# Patient Record
Sex: Male | Born: 1935 | Race: White | Hispanic: No
Health system: Southern US, Community
[De-identification: ages and names within clinical notes are randomized; demographics above are authoritative.]

## PROBLEM LIST (undated history)

## (undated) DIAGNOSIS — I2510 Atherosclerotic heart disease of native coronary artery without angina pectoris: Secondary | ICD-10-CM

## (undated) DIAGNOSIS — F0280 Dementia in other diseases classified elsewhere without behavioral disturbance: Secondary | ICD-10-CM

## (undated) DIAGNOSIS — K5900 Constipation, unspecified: Secondary | ICD-10-CM

## (undated) DIAGNOSIS — F329 Major depressive disorder, single episode, unspecified: Secondary | ICD-10-CM

## (undated) DIAGNOSIS — I10 Essential (primary) hypertension: Secondary | ICD-10-CM

## (undated) DIAGNOSIS — R32 Unspecified urinary incontinence: Secondary | ICD-10-CM

## (undated) DIAGNOSIS — E785 Hyperlipidemia, unspecified: Secondary | ICD-10-CM

## (undated) DIAGNOSIS — D649 Anemia, unspecified: Secondary | ICD-10-CM

## (undated) DIAGNOSIS — E876 Hypokalemia: Secondary | ICD-10-CM

## (undated) DIAGNOSIS — C679 Malignant neoplasm of bladder, unspecified: Secondary | ICD-10-CM

## (undated) DIAGNOSIS — E559 Vitamin D deficiency, unspecified: Secondary | ICD-10-CM

## (undated) DIAGNOSIS — N289 Disorder of kidney and ureter, unspecified: Secondary | ICD-10-CM

## (undated) DIAGNOSIS — G309 Alzheimer's disease, unspecified: Secondary | ICD-10-CM

## (undated) DIAGNOSIS — J449 Chronic obstructive pulmonary disease, unspecified: Secondary | ICD-10-CM

## (undated) DIAGNOSIS — I2699 Other pulmonary embolism without acute cor pulmonale: Secondary | ICD-10-CM

## (undated) DIAGNOSIS — K5790 Diverticulosis of intestine, part unspecified, without perforation or abscess without bleeding: Secondary | ICD-10-CM

## (undated) DIAGNOSIS — R1311 Dysphagia, oral phase: Secondary | ICD-10-CM

## (undated) DIAGNOSIS — K219 Gastro-esophageal reflux disease without esophagitis: Secondary | ICD-10-CM

---

## 2017-09-06 ENCOUNTER — Encounter: Payer: Self-pay | Admitting: Emergency Medicine

## 2017-09-06 ENCOUNTER — Emergency Department
Admission: EM | Admit: 2017-09-06 | Discharge: 2017-09-06 | Disposition: A | Discharge status: Home / Self Care | Payer: Medicare Other | Attending: Emergency Medicine | Admitting: Emergency Medicine

## 2017-09-06 DIAGNOSIS — Y999 Unspecified external cause status: Secondary | ICD-10-CM | POA: Insufficient documentation

## 2017-09-06 DIAGNOSIS — Y92129 Unspecified place in nursing home as the place of occurrence of the external cause: Secondary | ICD-10-CM | POA: Insufficient documentation

## 2017-09-06 DIAGNOSIS — G309 Alzheimer's disease, unspecified: Secondary | ICD-10-CM | POA: Insufficient documentation

## 2017-09-06 DIAGNOSIS — S20229A Contusion of unspecified back wall of thorax, initial encounter: Secondary | ICD-10-CM | POA: Diagnosis not present

## 2017-09-06 DIAGNOSIS — S3992XA Unspecified injury of lower back, initial encounter: Secondary | ICD-10-CM | POA: Diagnosis present

## 2017-09-06 DIAGNOSIS — W19XXXA Unspecified fall, initial encounter: Secondary | ICD-10-CM | POA: Insufficient documentation

## 2017-09-06 DIAGNOSIS — Y939 Activity, unspecified: Secondary | ICD-10-CM | POA: Insufficient documentation

## 2017-09-06 DIAGNOSIS — M79669 Pain in unspecified lower leg: Secondary | ICD-10-CM | POA: Diagnosis not present

## 2017-09-06 HISTORY — DX: Dementia in other diseases classified elsewhere, unspecified severity, without behavioral disturbance, psychotic disturbance, mood disturbance, and anxiety: F02.80

## 2017-09-06 HISTORY — DX: Alzheimer's disease, unspecified: G30.9

## 2017-09-06 NOTE — ED Notes (Signed)
Pt urinated 700 cc. Sample obtained in case needed. Sent to lab

## 2017-09-06 NOTE — ED Provider Notes (Signed)
[facility] Emergency Department Provider Note  Time seen: 11:52 AM  I have reviewed the triage vital signs and the nursing notes.   HISTORY  Chief Complaint Fall    HPI Joaquim Tolen is a 83 y.o. male with a past medical history of dementia presents to the emergency department after a fall. According to EMS report the patient had an unwitnessed fall at [facility]. Patient complained of leg pain and back pain so they sent the patient to the emergency department. Upon arrival the patient is awake alert, oriented to self only, but denies any complaints. Denies any pain. Patient is calm, cooperative and pleasant.Does not recall the fall.  Past Medical History:  Diagnosis Date  . Alzheimer's dementia     There are no active problems to display for this patient.   No past surgical history on file.  Prior to Admission medications   Not on File    Allergies not on file  No family history on file.  Social History Social History  Substance Use Topics  . Smoking status: Not on file  . Smokeless tobacco: Not on file  . Alcohol use Not on file    Review of Systems Unable to obtain an adequate/accurate review of systems due to significant baseline dementia.  ____________________________________________   PHYSICAL EXAM:  VITAL SIGNS: ED Triage Vitals  Enc Vitals Group     BP 09/06/17 1139 (!) 192/97     Pulse Rate 09/06/17 1139 70     Resp 09/06/17 1139 20     Temp 09/06/17 1139 97.8 F (36.6 C)     Temp Source 09/06/17 1139 Oral     SpO2 09/06/17 1139 97 %     Weight 09/06/17 1141 185 lb 6.5 oz (84.1 kg)     Height 09/06/17 1141 5\' 8"  (1.727 m)     Head Circumference --      Peak Flow --      Pain Score --      Pain Loc --      Pain Edu? --      Excl. in [place]? --    Constitutional: Alert, oriented to self, but not to place or time. Calm, cooperative, pleasant, no distress. Eyes: Normal exam ENT   Head: Normocephalic and  atraumatic. No abrasions, lacerations or hematomas.   Mouth/Throat: Mucous membranes are moist. Cardiovascular: Normal rate, regular rhythm.  Respiratory: Normal respiratory effort without tachypnea nor retractions. Breath sounds are clear Gastrointestinal: Soft and nontender. No distention.   Musculoskeletal: Nontender extremities, atraumatic in appearance. Great range of motion in all extremities and in all joints without any elicited pain. No appreciable edema. C-spine, T-spine and L-spine are nontender. Neurologic:  Normal speech and language. No gross focal neurologic deficits are appreciated. Skin:  Skin is warm, dry and intact.  Psychiatric: Mood and affect are normal.   ____________________________________________   INITIAL IMPRESSION / ASSESSMENT AND PLAN / ED COURSE  Pertinent labs & imaging results that were available during my care of the patient were reviewed by me and considered in my medical decision making (see chart for details).  The patient presents to the emergency department from [facility] after an unwitnessed fall. Differential diagnosis at this time would include contusions, Musca skeletal pain, no traumatic issues. Overall the patient appears extremely well, has no complaints but does have dementia. On physical exam patient has no complaints of tenderness, great range of motion in all joints and extremities, no signs of trauma  on exam, atraumatic head, no C, T or L-spine tenderness. No bruising or abrasions noted. As the patient appears extremely well we will attempt to ambulate the patient to ensure he is not having any pain. The patient has no pain did not believe imaging is warranted given a normal physical exam, and we will likely discharge back to his nursing facility.  Family is now here. Patient continues to appear well, has no complaints. Patient does not ambulate at baseline uses a wheelchair. Family agrees patient appears well. We will discharge back to  his nursing facility.  ____________________________________________   FINAL CLINICAL IMPRESSION(S) / ED DIAGNOSES  Cheral Marker, MD 09/06/17 1341

## 2017-09-06 NOTE — ED Triage Notes (Signed)
Patient presents to the ED via EMS from [facility].  EMS reports they were called to facility due to an unwitnessed fall and that patient had been complaining of back and leg pain.  Patient has history of alzheimer's.  At this time patient is alert but not oriented to self only.  Patient denies pain at this time and does not remember fall.

## 2017-09-06 NOTE — ED Notes (Signed)
Paperwork sent back to nursing home with ems. Pt unable to sign

## 2018-07-07 ENCOUNTER — Other Ambulatory Visit: Payer: Self-pay

## 2018-07-07 ENCOUNTER — Encounter: Payer: Self-pay | Admitting: Emergency Medicine

## 2018-07-07 ENCOUNTER — Emergency Department
Admission: EM | Admit: 2018-07-07 | Discharge: 2018-07-07 | Disposition: A | Discharge status: Skilled Nursing Facility | Payer: Medicare Other | Attending: Emergency Medicine | Admitting: Emergency Medicine

## 2018-07-07 DIAGNOSIS — F0280 Dementia in other diseases classified elsewhere without behavioral disturbance: Secondary | ICD-10-CM

## 2018-07-07 DIAGNOSIS — F0281 Dementia in other diseases classified elsewhere with behavioral disturbance: Secondary | ICD-10-CM | POA: Diagnosis not present

## 2018-07-07 DIAGNOSIS — J449 Chronic obstructive pulmonary disease, unspecified: Secondary | ICD-10-CM | POA: Diagnosis not present

## 2018-07-07 DIAGNOSIS — G301 Alzheimer's disease with late onset: Secondary | ICD-10-CM | POA: Diagnosis not present

## 2018-07-07 DIAGNOSIS — I10 Essential (primary) hypertension: Secondary | ICD-10-CM | POA: Insufficient documentation

## 2018-07-07 DIAGNOSIS — Z7982 Long term (current) use of aspirin: Secondary | ICD-10-CM | POA: Insufficient documentation

## 2018-07-07 DIAGNOSIS — Z79899 Other long term (current) drug therapy: Secondary | ICD-10-CM | POA: Diagnosis not present

## 2018-07-07 DIAGNOSIS — F02818 Dementia in other diseases classified elsewhere, unspecified severity, with other behavioral disturbance: Secondary | ICD-10-CM | POA: Diagnosis present

## 2018-07-07 DIAGNOSIS — I2510 Atherosclerotic heart disease of native coronary artery without angina pectoris: Secondary | ICD-10-CM | POA: Insufficient documentation

## 2018-07-07 DIAGNOSIS — R4182 Altered mental status, unspecified: Secondary | ICD-10-CM | POA: Diagnosis present

## 2018-07-07 HISTORY — DX: Constipation, unspecified: K59.00

## 2018-07-07 HISTORY — DX: Dysphagia, oral phase: R13.11

## 2018-07-07 HISTORY — DX: Major depressive disorder, single episode, unspecified: F32.9

## 2018-07-07 HISTORY — DX: Unspecified urinary incontinence: R32

## 2018-07-07 HISTORY — DX: Essential (primary) hypertension: I10

## 2018-07-07 HISTORY — DX: Disorder of kidney and ureter, unspecified: N28.9

## 2018-07-07 HISTORY — DX: Vitamin D deficiency, unspecified: E55.9

## 2018-07-07 HISTORY — DX: Hypokalemia: E87.6

## 2018-07-07 HISTORY — DX: Secondary malignant neoplasm of genital organs: C67.9

## 2018-07-07 HISTORY — DX: Atherosclerotic heart disease of native coronary artery without angina pectoris: I25.10

## 2018-07-07 HISTORY — DX: Diverticulosis of intestine, part unspecified, without perforation or abscess without bleeding: K57.90

## 2018-07-07 HISTORY — DX: Anemia, unspecified: D64.9

## 2018-07-07 HISTORY — DX: Chronic obstructive pulmonary disease, unspecified: J44.9

## 2018-07-07 HISTORY — DX: Gastro-esophageal reflux disease without esophagitis: K21.9

## 2018-07-07 HISTORY — DX: Other pulmonary embolism without acute cor pulmonale: I26.99

## 2018-07-07 HISTORY — DX: Hyperlipidemia, unspecified: E78.5

## 2018-07-07 LAB — URINALYSIS, COMPLETE (UACMP) WITH MICROSCOPIC
BILIRUBIN URINE: NEGATIVE
Bacteria, UA: NONE SEEN
Glucose, UA: NEGATIVE mg/dL
HGB URINE DIPSTICK: NEGATIVE
Ketones, ur: NEGATIVE mg/dL
Leukocytes, UA: NEGATIVE
NITRITE: NEGATIVE
PROTEIN: NEGATIVE mg/dL
Specific Gravity, Urine: 1.008 (ref 1.005–1.030)
pH: 7 (ref 5.0–8.0)

## 2018-07-07 LAB — COMPREHENSIVE METABOLIC PANEL
ALT: 43 U/L (ref 0–44)
ANION GAP: 5 (ref 5–15)
AST: 60 U/L — ABNORMAL HIGH (ref 15–41)
Albumin: 3.3 g/dL — ABNORMAL LOW (ref 3.5–5.0)
Alkaline Phosphatase: 68 U/L (ref 38–126)
BUN: 21 mg/dL (ref 8–23)
CHLORIDE: 108 mmol/L (ref 98–111)
CO2: 26 mmol/L (ref 22–32)
CREATININE: 1.14 mg/dL (ref 0.61–1.24)
Calcium: 8.7 mg/dL — ABNORMAL LOW (ref 8.9–10.3)
GFR, EST NON AFRICAN AMERICAN: 57 mL/min — AB (ref >60)
Glucose, Bld: 93 mg/dL (ref 70–99)
POTASSIUM: 3.7 mmol/L (ref 3.5–5.1)
SODIUM: 139 mmol/L (ref 135–145)
Total Bilirubin: 1.1 mg/dL (ref 0.3–1.2)
Total Protein: 6.4 g/dL — ABNORMAL LOW (ref 6.5–8.1)

## 2018-07-07 LAB — CBC WITH DIFFERENTIAL/PLATELET
BASOS PCT: 2 %
Basophils Absolute: 0.1 10*3/uL (ref 0–0.1)
EOS ABS: 0.5 10*3/uL (ref 0–0.7)
Eosinophils Relative: 10 %
HEMATOCRIT: 39.2 % — AB (ref 40.0–52.0)
Hemoglobin: 13.2 g/dL (ref 13.0–18.0)
LYMPHS ABS: 1.2 10*3/uL (ref 1.0–3.6)
Lymphocytes Relative: 24 %
MCH: 31.4 pg (ref 26.0–34.0)
MCHC: 33.7 g/dL (ref 32.0–36.0)
MCV: 93.2 fL (ref 80.0–100.0)
Monocytes Absolute: 0.5 10*3/uL (ref 0.2–1.0)
Monocytes Relative: 10 %
NEUTROS ABS: 2.6 10*3/uL (ref 1.4–6.5)
Neutrophils Relative %: 54 %
Platelets: 124 10*3/uL — ABNORMAL LOW (ref 150–440)
RBC: 4.2 MIL/uL — AB (ref 4.40–5.90)
RDW: 14.8 % — ABNORMAL HIGH (ref 11.5–14.5)
WBC: 4.7 10*3/uL (ref 3.8–10.6)

## 2018-07-07 MED ORDER — LISINOPRIL 10 MG PO TABS
40.0000 mg | ORAL_TABLET | Freq: Once | ORAL | Status: AC
  Administered 2018-07-07: 40 mg via ORAL
  Filled 2018-07-07: qty 4
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
## 2018-07-07 NOTE — Discharge Instructions (Addendum)
Patient with normal medical evaluation.  He is doing well here and cooperating with everyone.  Please have him follow-up with his doctor and return for any further problems.

## 2018-07-07 NOTE — ED Notes (Signed)
Called [facility] for transport to [place]

## 2018-07-07 NOTE — ED Notes (Signed)
Pt being transported back to [facility] by ems.
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
## 2018-07-12 ENCOUNTER — Emergency Department: Payer: Medicare Other

## 2018-07-12 ENCOUNTER — Other Ambulatory Visit
Admission: RE | Admit: 2018-07-12 | Discharge: 2018-07-12 | Disposition: A | Discharge status: Home / Self Care | Payer: Medicare Other | Source: Ambulatory Visit | Attending: Internal Medicine | Admitting: Internal Medicine

## 2018-07-12 ENCOUNTER — Emergency Department
Admission: EM | Admit: 2018-07-12 | Discharge: 2018-07-12 | Disposition: A | Discharge status: Skilled Nursing Facility | Payer: Medicare Other | Attending: Emergency Medicine | Admitting: Emergency Medicine

## 2018-07-12 ENCOUNTER — Other Ambulatory Visit: Payer: Self-pay

## 2018-07-12 ENCOUNTER — Encounter: Payer: Self-pay | Admitting: Emergency Medicine

## 2018-07-12 DIAGNOSIS — Z8546 Personal history of malignant neoplasm of prostate: Secondary | ICD-10-CM | POA: Diagnosis not present

## 2018-07-12 DIAGNOSIS — I10 Essential (primary) hypertension: Secondary | ICD-10-CM | POA: Diagnosis not present

## 2018-07-12 DIAGNOSIS — R319 Hematuria, unspecified: Secondary | ICD-10-CM

## 2018-07-12 DIAGNOSIS — N390 Urinary tract infection, site not specified: Secondary | ICD-10-CM | POA: Diagnosis not present

## 2018-07-12 DIAGNOSIS — J449 Chronic obstructive pulmonary disease, unspecified: Secondary | ICD-10-CM | POA: Diagnosis not present

## 2018-07-12 DIAGNOSIS — Z79899 Other long term (current) drug therapy: Secondary | ICD-10-CM | POA: Diagnosis not present

## 2018-07-12 DIAGNOSIS — F0280 Dementia in other diseases classified elsewhere without behavioral disturbance: Secondary | ICD-10-CM | POA: Diagnosis not present

## 2018-07-12 DIAGNOSIS — G309 Alzheimer's disease, unspecified: Secondary | ICD-10-CM | POA: Diagnosis not present

## 2018-07-12 DIAGNOSIS — I2510 Atherosclerotic heart disease of native coronary artery without angina pectoris: Secondary | ICD-10-CM | POA: Insufficient documentation

## 2018-07-12 DIAGNOSIS — Z7982 Long term (current) use of aspirin: Secondary | ICD-10-CM | POA: Insufficient documentation

## 2018-07-12 DIAGNOSIS — R451 Restlessness and agitation: Secondary | ICD-10-CM | POA: Insufficient documentation

## 2018-07-12 LAB — URINALYSIS, COMPLETE (UACMP) WITH MICROSCOPIC
BACTERIA UA: NONE SEEN
SQUAMOUS EPITHELIAL / LPF: NONE SEEN (ref 0–5)
Specific Gravity, Urine: 1.014 (ref 1.005–1.030)
WBC, UA: >50 WBC/hpf — ABNORMAL HIGH (ref 0–5)

## 2018-07-12 LAB — CBC WITH DIFFERENTIAL/PLATELET
BASOS ABS: 0.1 10*3/uL (ref 0–0.1)
Basophils Relative: 1 %
Eosinophils Absolute: 0.3 10*3/uL (ref 0–0.7)
Eosinophils Relative: 4 %
HEMATOCRIT: 38.2 % — AB (ref 40.0–52.0)
Hemoglobin: 12.9 g/dL — ABNORMAL LOW (ref 13.0–18.0)
Lymphocytes Relative: 18 %
Lymphs Abs: 1.3 10*3/uL (ref 1.0–3.6)
MCH: 31.3 pg (ref 26.0–34.0)
MCHC: 33.6 g/dL (ref 32.0–36.0)
MCV: 93.2 fL (ref 80.0–100.0)
MONO ABS: 0.6 10*3/uL (ref 0.2–1.0)
Monocytes Relative: 8 %
NEUTROS ABS: 5.1 10*3/uL (ref 1.4–6.5)
Neutrophils Relative %: 69 %
Platelets: 132 10*3/uL — ABNORMAL LOW (ref 150–440)
RBC: 4.11 MIL/uL — ABNORMAL LOW (ref 4.40–5.90)
RDW: 14.7 % — AB (ref 11.5–14.5)
WBC: 7.4 10*3/uL (ref 3.8–10.6)

## 2018-07-12 LAB — COMPREHENSIVE METABOLIC PANEL
ALK PHOS: 68 U/L (ref 38–126)
ALT: 40 U/L (ref 0–44)
AST: 55 U/L — AB (ref 15–41)
Albumin: 3.4 g/dL — ABNORMAL LOW (ref 3.5–5.0)
Anion gap: 7 (ref 5–15)
BILIRUBIN TOTAL: 1.1 mg/dL (ref 0.3–1.2)
BUN: 21 mg/dL (ref 8–23)
CALCIUM: 8.7 mg/dL — AB (ref 8.9–10.3)
CO2: 26 mmol/L (ref 22–32)
Chloride: 105 mmol/L (ref 98–111)
Creatinine, Ser: 1.05 mg/dL (ref 0.61–1.24)
GFR calc Af Amer: >60 mL/min (ref >60)
Glucose, Bld: 105 mg/dL — ABNORMAL HIGH (ref 70–99)
POTASSIUM: 3.6 mmol/L (ref 3.5–5.1)
Sodium: 138 mmol/L (ref 135–145)
TOTAL PROTEIN: 6.5 g/dL (ref 6.5–8.1)

## 2018-07-12 LAB — PROTIME-INR
INR: 1.1
Prothrombin Time: 14.1 seconds (ref 11.4–15.2)

## 2018-07-12 LAB — APTT: APTT: 31 s (ref 24–36)

## 2018-07-12 LAB — LIPASE, BLOOD: Lipase: 27 U/L (ref 11–51)

## 2018-07-12 MED ORDER — AMOXICILLIN-POT CLAVULANATE 875-125 MG PO TABS: 1.0000 | ORAL_TABLET | Freq: Two times a day (BID) | ORAL | 0 refills | Status: DC

## 2018-07-12 MED ORDER — AMOXICILLIN-POT CLAVULANATE 875-125 MG PO TABS: 1.0000 | ORAL_TABLET | Freq: Two times a day (BID) | ORAL | 0 refills | Status: AC

## 2018-07-12 MED ORDER — SODIUM CHLORIDE 0.9 % IV SOLN
1.0000 g | Freq: Once | INTRAVENOUS | Status: AC
  Administered 2018-07-12: 1 g via INTRAVENOUS
  Filled 2018-07-12: qty 10

## 2018-07-12 NOTE — ED Notes (Signed)
Daughter left before pt - pt unable to sign for discharge.

## 2018-07-12 NOTE — ED Notes (Signed)
Spoke with [facility] - gave them an update and they know pt will be returning by ems.

## 2018-07-12 NOTE — ED Notes (Signed)
Spoke with lab. They sent a urine sample this morning for Steven Ruiz from [facility]. Our order is a duplicate .

## 2018-07-12 NOTE — ED Provider Notes (Addendum)
[facility] Emergency Department Provider Note  ____________________________________________   First MD Initiated Contact with Patient 07/12/18 1145     (approximate)  I have reviewed the triage vital signs and the nursing notes.   HISTORY  Chief Complaint Hematuria  EM caveat: Dementia prohibits some elements of history and physical  HPI Steven Ruiz is a 84 y.o. male with a history of Alzheimer's, COPD, depression pulmonary embolism  Patient presents today for evaluation of slight increased agitation, and hematuria reported his care facility.  Patient himself has no complaints at this time.  He is resting comfortably.  Also reported that the patient had a urinalysis sent from his care facility today.  They have been seeing hematuria occurring about every 6-7 hours.  He has had blood noted in his depends.  No report of any fevers.  Occasionally has had some slight agitation, this is of been evidently been apparent also over the last 1 week or so and was seen in the ER for similar.  Patient denies pain.  Denies discomfort or concerns himself.  He is not aware of his hematuria.  Past Medical History:  Diagnosis Date  . Alzheimer's dementia   . Anemia   . Constipation   . COPD (chronic obstructive pulmonary disease) ([place])   . Coronary artery disease   . Diverticulosis   . Dysphagia, oral phase   . GERD (gastroesophageal reflux disease)   . Hyperlipemia   . Hypertension   . Hypokalemia   . Major depressive disorder   . 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 Pulmonary embolism ([place])   . Renal disorder   . Urinary incontinence   . Vitamin D deficiency     Patient Active Problem List   Diagnosis Date Noted  . Alzheimer's type dementia with late onset with behavioral disturbance 07/07/2018    History reviewed. No pertinent surgical history.  Prior to Admission medications   Medication Sig Start  Date End Date Taking? Authorizing Provider  acetaminophen (TYLENOL) 325 MG tablet Take 650 mg by mouth 3 (three) times daily as needed for mild pain.     [provider]  amoxicillin-clavulanate (AUGMENTIN) 875-125 MG tablet Take 1 tablet by mouth 2 (two) times daily for 7 days. 07/12/18 07/19/18  Delman Kitten, MD  aspirin EC 81 MG tablet Take 81 mg by mouth daily.    [provider]  atorvastatin (LIPITOR) 40 MG tablet Take 40 mg by mouth at bedtime.    [provider]  budesonide-formoterol (SYMBICORT) 160-4.5 MCG/ACT inhaler Inhale 2 puffs into the lungs 2 (two) times daily.    [provider]  Cholecalciferol ([employer] D3) 2000 units CAPS Take 2,000 Units by mouth daily.    [provider]  docusate sodium (COLACE) 100 MG capsule Take 100 mg by mouth 2 (two) times daily.    [provider]  fosinopril (MONOPRIL) 40 MG tablet Take 40 mg by mouth daily.     [provider]  isosorbide mononitrate (IMDUR) 60 MG 24 hr tablet Take 60 mg by mouth daily.    [provider]  Melatonin 3 MG TBDP Take 3 mg by mouth at bedtime.    [provider]  memantine (NAMENDA) 10 MG tablet Take 10 mg by mouth 2 (two) times daily.    [provider]  omeprazole (PRILOSEC) 10 MG capsule Take 10 mg by mouth daily.    [provider]  tamsulosin [facility])  0.4 MG CAPS capsule Take 0.8 mg by mouth at bedtime.    [provider]  traZODone (DESYREL) 50 MG tablet Take 12.5 mg by mouth at bedtime.    [provider]    Allergies Oxybutynin; Rabeprazole; and Terazosin  History reviewed. No pertinent family history.  Social History Social History   Tobacco Use  . Smoking status: Unknown If Ever Smoked  . Smokeless tobacco: Never Used  Substance Use Topics  . Alcohol use: Not Currently  . Drug use: Not Currently    Review of Systems EM  caveat    ____________________________________________   PHYSICAL EXAM:  VITAL SIGNS: ED Triage Vitals  Enc Vitals Group     BP 07/12/18 1132 (!) 153/99     Pulse Rate 07/12/18 1132 86     Resp 07/12/18 1132 17     Temp 07/12/18 1132 97.7 F (36.5 C)     Temp Source 07/12/18 1132 Oral     SpO2 07/12/18 1132 100 %     Weight 07/12/18 1138 184 lb 15.5 oz (83.9 kg)     Height 07/12/18 1138 5\' 10"  (1.778 m)     Head Circumference --      Peak Flow --      Pain Score 07/12/18 1138 0     Pain Loc --      Pain Edu? --      Excl. in [place]? --     Constitutional: Alert and oriented. Well appearing and in no acute distress.  Pleasant, but slightly confused.  Oriented to name and being in a hospital but not to date or time.  Is not aware of the cause for his hospital visit. Eyes: Conjunctivae are normal. Head: Atraumatic. Nose: No congestion/rhinnorhea. Mouth/Throat: Mucous membranes are moist. Neck: No stridor.   Cardiovascular: Normal rate, regular rhythm. Grossly normal heart sounds.  Good peripheral circulation. Respiratory: Normal respiratory effort.  No retractions. Lungs CTAB. Gastrointestinal: Soft and nontender. No distention.  Patient logrolled with nursing staff, no evident abscess or fluid collection to noted over the perineum buttocks or gluteus regions bilaterally.  Nothing evident on external exam.  He does have some slight amount of hematuria, but is also able to produce urine that is yellow and appears to slightly concentrated in nature with a abnormal somewhat foul odor.  No distention of the urinary bladder.  No suprapubic or other abdominal pain. Musculoskeletal: No lower extremity tenderness nor edema. Neurologic:  Normal speech and language. No gross focal neurologic deficits are appreciated.  Skin:  Skin is warm, dry and intact. No rash noted. Psychiatric: Mood and affect are slightly flat, calm. Speech and behavior are normal without signs of agitation.  He is  pleasant. ____________________________________________   LABS (all labs ordered are listed, but only abnormal results are displayed)  Labs Reviewed  CBC WITH DIFFERENTIAL/PLATELET - Abnormal; Notable for the following components:      Result Value   RBC 4.11 (*)    Hemoglobin 12.9 (*)    HCT 38.2 (*)    RDW 14.7 (*)    Platelets 132 (*)    All other components within normal limits  COMPREHENSIVE METABOLIC PANEL - Abnormal; Notable for the following components:   Glucose, Bld 105 (*)    Calcium 8.7 (*)    Albumin 3.4 (*)    AST 55 (*)    All other components within normal limits  LIPASE, BLOOD  PROTIME-INR  APTT   ____________________________________________  EKG  Reviewed and interpreted  by at 1140 Heart rate 75 QRS 159 QTc 4 7 Normal sinus rhythm, occasional sinus arrhythmia.  Right bundle branch block without ischemia. ____________________________________________  RADIOLOGY Chest x-ray reviewed negative for acute  Ct Renal Stone Study IMPRESSION: 1. Slightly irregular hyperdensity along the posterior bladder wall could reflect layering hemorrhage. Cystoscopy is recommended to exclude mass. 2. Nonobstructive right nephrolithiasis. 3. Partially visualized 3.8 cm intermediate density in the inferior left gluteus maximus muscle with mild left gluteal and ischioanal soft tissue stranding. This is favored to reflect hematoma. Correlate with physical exam. If no recent history of trauma or obvious bruising/swelling on physical exam, non-emergent MRI of the pelvis with and without contrast could be performed for further characterization. 4.  Prominent stool throughout the colon favors constipation. 5. Slightly nodular liver contour, suggestive of cirrhosis. 6.  Aortic atherosclerosis (ICD10-I70.0). Electronically Signed   By: Titus Dubin M.D.   On: 07/12/2018 12:50   Examination, no evidence of a lesion or hematoma on clinical exam of the area to noted with 3.8 cm indeterminate  density.  CT of the head reviewed, negative for acute  ____________________________________________   PROCEDURES  Procedure(s) performed: None  Procedures  Critical Care performed: No  ____________________________________________   INITIAL IMPRESSION / ASSESSMENT AND PLAN / ED COURSE  Pertinent labs & imaging results that were available during my care of the patient were reviewed by me and considered in my medical decision making (see chart for details).   Patient presents for hematuria.  Having some behavioral disturbances and evidently agitation and his nursing home.  Lab work including urinalysis reviewed.  He had a urinalysis at his nursing facility today that shows notable reds and whites.  Given the presentation today, urinary tract infection with hemorrhagic cystitis is strongly considered, though certainly a bladder mass or tumor is also considered.  His mental status is calm, pleasant, and he shows no evidence of unstable vital signs or sepsis.  He is able to urinate occasionally with some blood in it but also occasionally with yellow slightly concentrated urine.  No signs of distention or urinary obstruction.  Will initiate Rocephin given the high likelihood of possible infectious etiology, plan to discharge with prescription for Augmentin for 7 days.  Follow-up with his primary care doctor and urologist advised.      ____________________________________________   FINAL CLINICAL IMPRESSION(S) / ED DIAGNOSES  Final diagnoses:  Hematuria, unspecified type  Lower urinary tract infectious disease      NEW MEDICATIONS STARTED DURING THIS VISIT:  Current Discharge Medication List    START taking these medications   Details  amoxicillin-clavulanate (AUGMENTIN) 875-125 MG tablet Take 1 tablet by mouth 2 (two) times daily for 7 days. Qty: 14 tablet, Refills: 0         Note:  This document was prepared using Dragon voice recognition software and may include  unintentional dictation errors.     Delman Kitten, MD 07/12/18 1401   Patient's daughter Almyra Free is at the bedside, she is very helpful and supportive in his care.  She reports he does have a long history of prostate issues and previous prostate cancer.  In agreement with plan to treat with IV antibiotic here and antibiotic at the nursing home.  They will follow-up closely with her doctor and to see urology at the [facility], but her also interested in local urology care in the event they are unable to get into urology there at a timely manner.  Careful return precautions and treatment  recommendations discussed with daughter.  She is in agreement with plan.  Patient resting comfortably in no distress with normal vital signs minus some slight hypertension    Delman Kitten, MD 07/12/18 1521    Delman Kitten, MD 07/12/18 1610

## 2018-07-12 NOTE — Discharge Instructions (Signed)
You have been seen in the Emergency Department (ED) today for blood when urinating.  Your workup today suggests that you may have a urinary tract infection (UTI) or a mass in the bladder.  Follow-up with Urology (call tomorrow for next available follow-up) and your primary care doctor.  Call your regular doctor to schedule the next available appointment to follow up on todays ED visit, or return immediately to the ED if your pain worsens, you have decreased urine production, develop fever, persistent vomiting, or other symptoms that concern you.

## 2018-07-12 NOTE — ED Triage Notes (Signed)
Pt has hematuria x 6-7 hrs per [facility] staff.

## 2018-07-13 LAB — URINE CULTURE: Culture: NO GROWTH

## 2018-07-19 ENCOUNTER — Other Ambulatory Visit
Admission: RE | Admit: 2018-07-19 | Discharge: 2018-07-19 | Disposition: A | Discharge status: Home / Self Care | Payer: Medicare Other | Source: Skilled Nursing Facility | Attending: Family Medicine | Admitting: Family Medicine

## 2018-07-19 DIAGNOSIS — N183 Chronic kidney disease, stage 3 (moderate): Secondary | ICD-10-CM | POA: Diagnosis present

## 2018-07-19 LAB — COMPREHENSIVE METABOLIC PANEL
ALBUMIN: 3.1 g/dL — AB (ref 3.5–5.0)
ALT: 30 U/L (ref 0–44)
ANION GAP: 5 (ref 5–15)
AST: 43 U/L — ABNORMAL HIGH (ref 15–41)
Alkaline Phosphatase: 71 U/L (ref 38–126)
BILIRUBIN TOTAL: 0.9 mg/dL (ref 0.3–1.2)
BUN: 22 mg/dL (ref 8–23)
CO2: 27 mmol/L (ref 22–32)
Calcium: 8 mg/dL — ABNORMAL LOW (ref 8.9–10.3)
Chloride: 106 mmol/L (ref 98–111)
Creatinine, Ser: 1.05 mg/dL (ref 0.61–1.24)
GFR calc Af Amer: >60 mL/min (ref >60)
GFR calc non Af Amer: >60 mL/min (ref >60)
Glucose, Bld: 107 mg/dL — ABNORMAL HIGH (ref 70–99)
POTASSIUM: 3.5 mmol/L (ref 3.5–5.1)
Sodium: 138 mmol/L (ref 135–145)
TOTAL PROTEIN: 5.9 g/dL — AB (ref 6.5–8.1)

## 2018-07-19 LAB — CBC WITH DIFFERENTIAL/PLATELET
BASOS PCT: 1 %
Basophils Absolute: 0 10*3/uL (ref 0–0.1)
Eosinophils Absolute: 0.4 10*3/uL (ref 0–0.7)
Eosinophils Relative: 8 %
HCT: 36.1 % — ABNORMAL LOW (ref 40.0–52.0)
HEMOGLOBIN: 12.3 g/dL — AB (ref 13.0–18.0)
Lymphocytes Relative: 29 %
Lymphs Abs: 1.6 10*3/uL (ref 1.0–3.6)
MCH: 31.5 pg (ref 26.0–34.0)
MCHC: 34.2 g/dL (ref 32.0–36.0)
MCV: 92 fL (ref 80.0–100.0)
MONOS PCT: 10 %
Monocytes Absolute: 0.5 10*3/uL (ref 0.2–1.0)
NEUTROS PCT: 52 %
Neutro Abs: 2.8 10*3/uL (ref 1.4–6.5)
Platelets: 144 10*3/uL — ABNORMAL LOW (ref 150–440)
RBC: 3.92 MIL/uL — ABNORMAL LOW (ref 4.40–5.90)
RDW: 15 % — ABNORMAL HIGH (ref 11.5–14.5)
WBC: 5.3 10*3/uL (ref 3.8–10.6)

## 2018-07-19 LAB — APTT: APTT: 27 s (ref 24–36)

## 2018-07-19 LAB — PROTIME-INR
INR: 1.02
PROTHROMBIN TIME: 13.3 s (ref 11.4–15.2)

## 2018-07-19 LAB — AMMONIA: Ammonia: 14 umol/L (ref 9–35)

## 2018-09-29 IMAGING — CT CT HEAD W/O CM
3 series · 15 of 46 positions shown, 18 images · non-contrast
Comparison: None.

CLINICAL DATA: Confusion altered mental status. Alzheimer's
disease.

EXAM:
CT HEAD WITHOUT CONTRAST
TECHNIQUE: Contiguous axial images were obtained from the base of the skull
through the vertex without intravenous contrast.

[Series 2: head wo · axial · 0.47mm/px · z∈[-140,-20]mm · 9 of 29 slices shown, 12 images]
[im 3/29  brain]
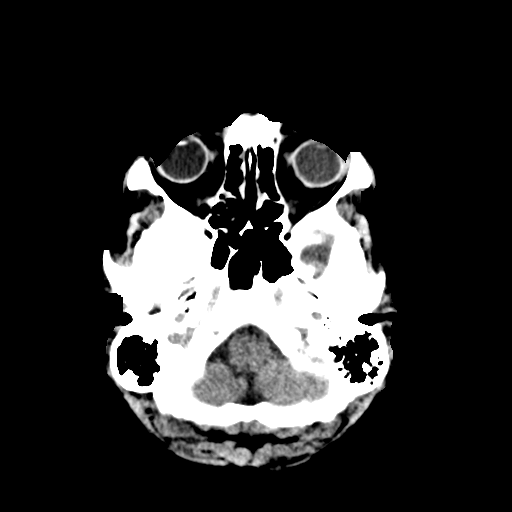
[im 3/29  bone]
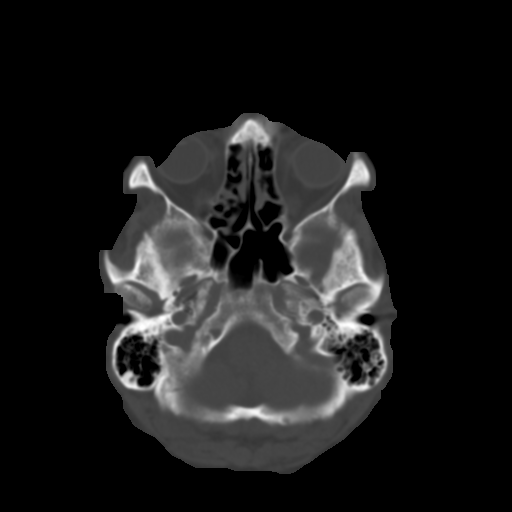
[im 6/29  brain]
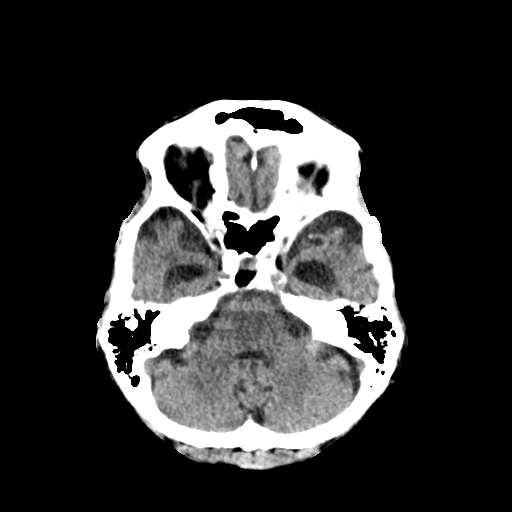
[im 9/29  brain]
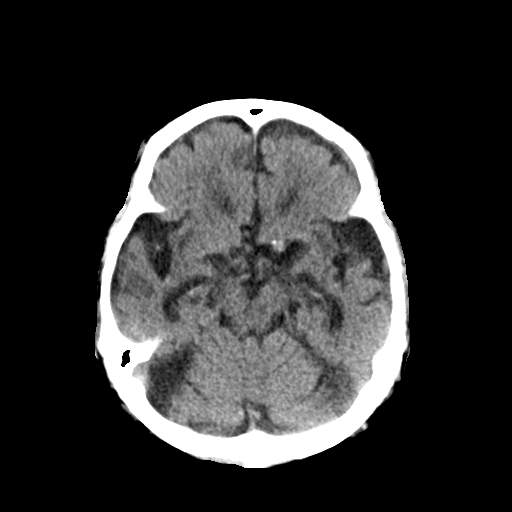
[im 12/29  brain]
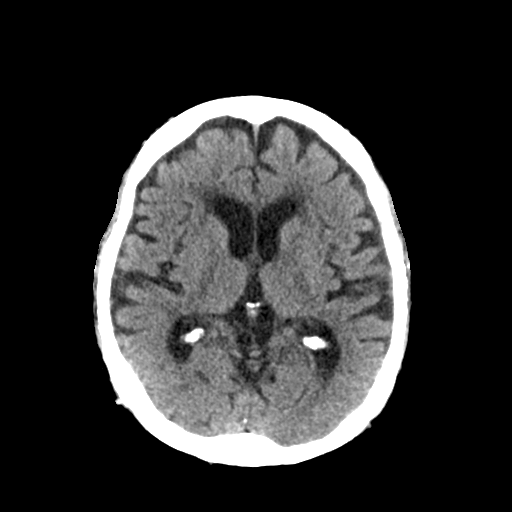
[im 15/29  brain]
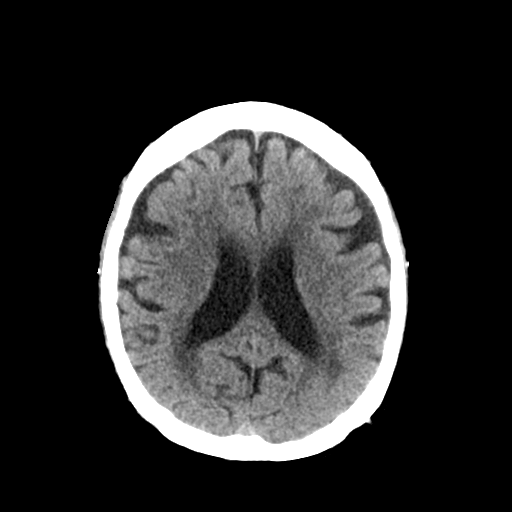
[im 15/29  bone]
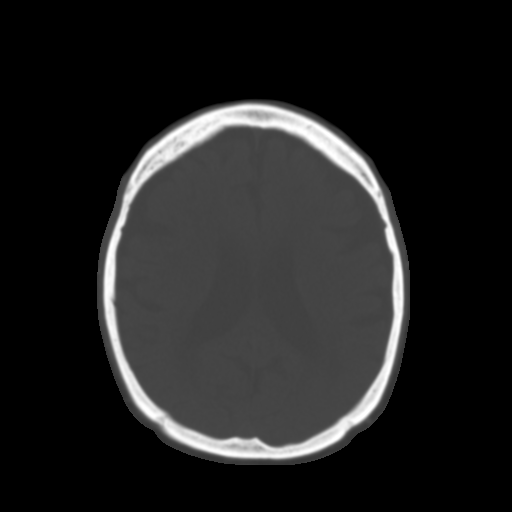
[im 18/29  brain]
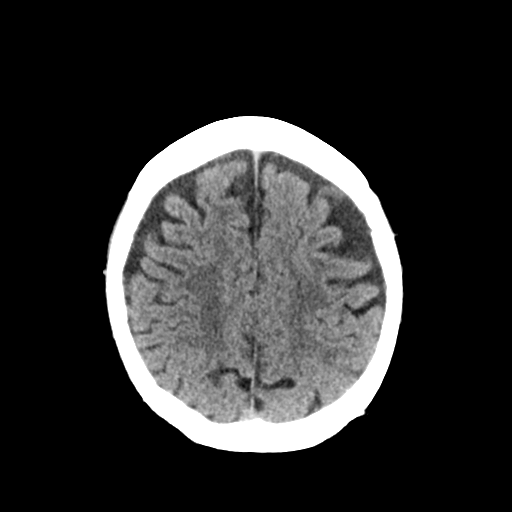
[im 21/29  brain]
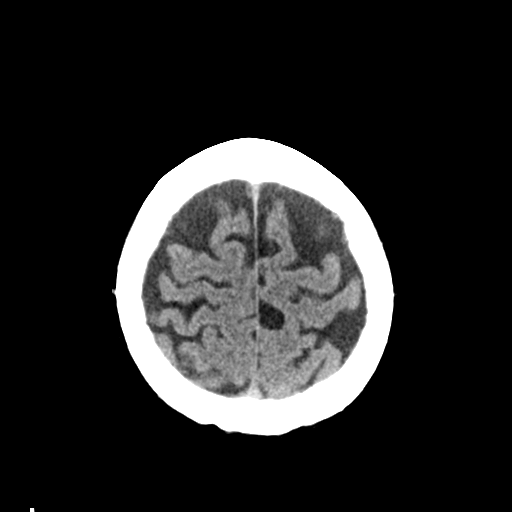
[im 24/29  brain]
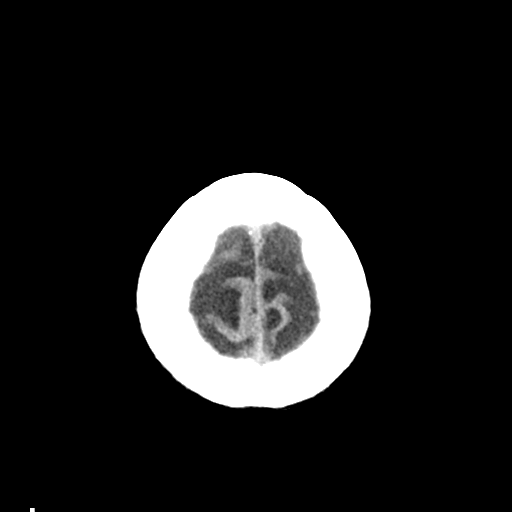
[im 27/29  brain]
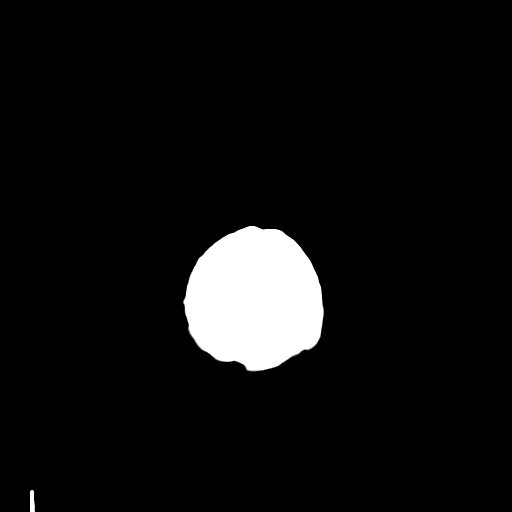
[im 27/29  bone]
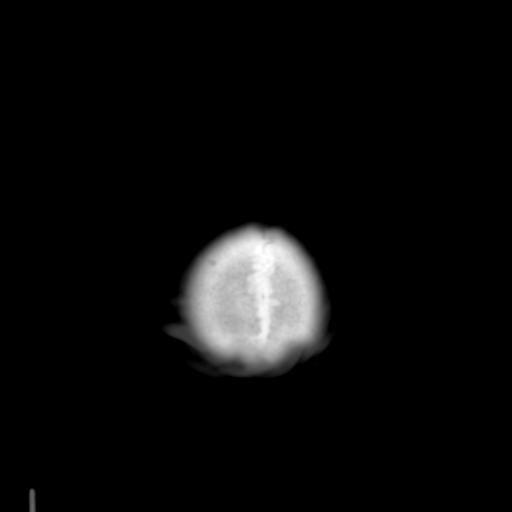

[Series 4: coronal soft tissue · coronal · 0.28mm/px · 3 of 63 slices shown]
[im 21/63  brain]
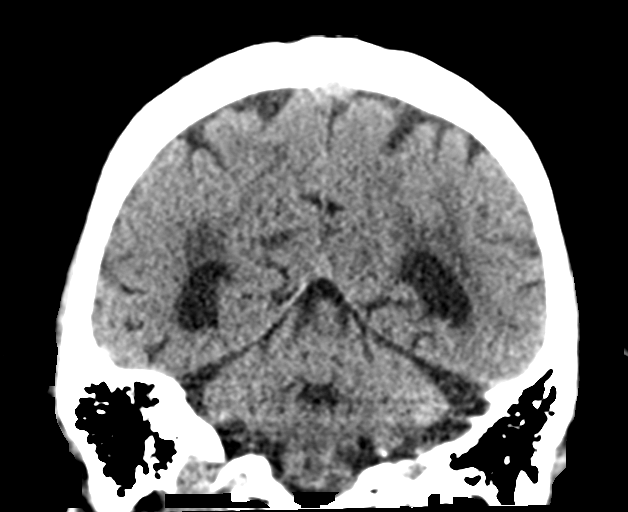
[im 28/63  brain]
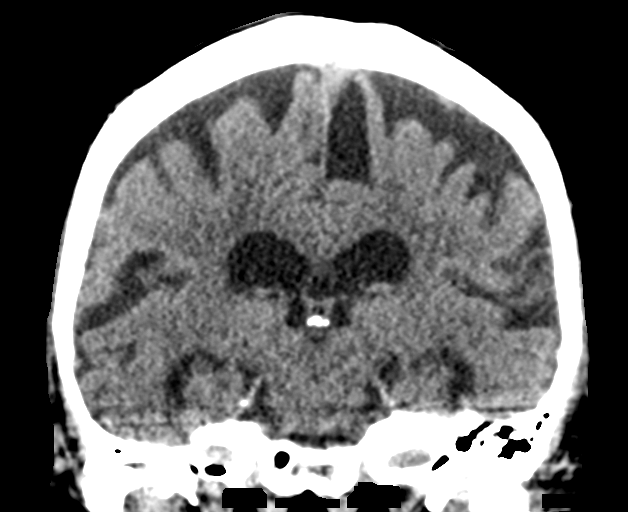
[im 35/63  brain]
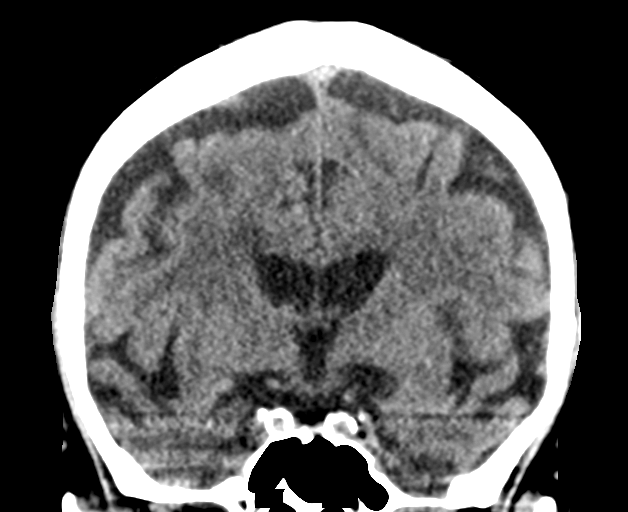

[Series 5: sagittal soft tissue · sagittal · 0.29mm/px · 3 of 54 slices shown]
[im 18/54  brain]
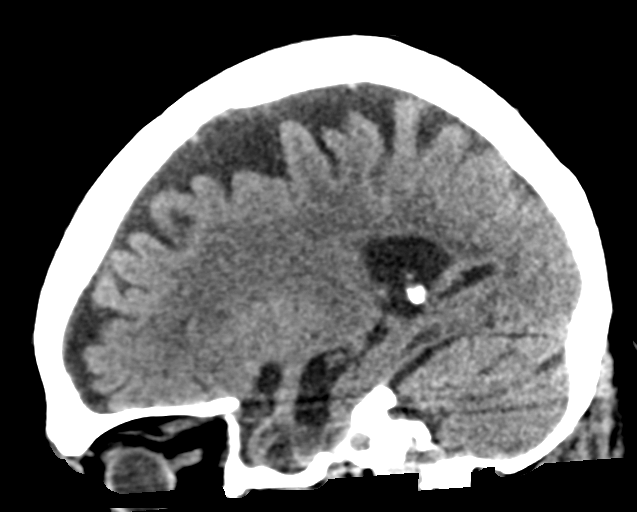
[im 27/54  brain]
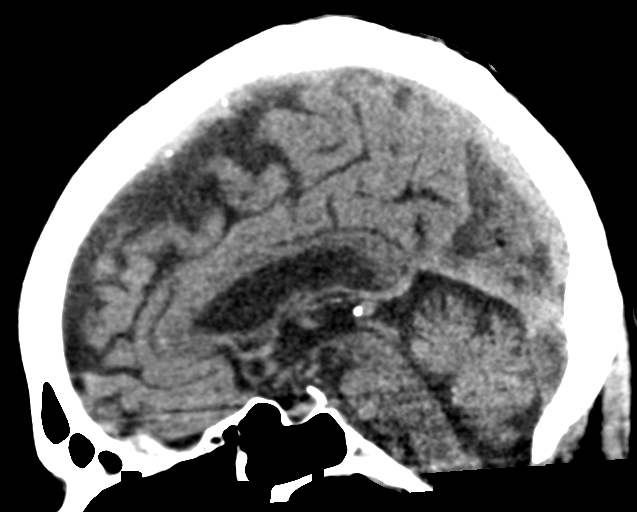
[im 36/54  brain]
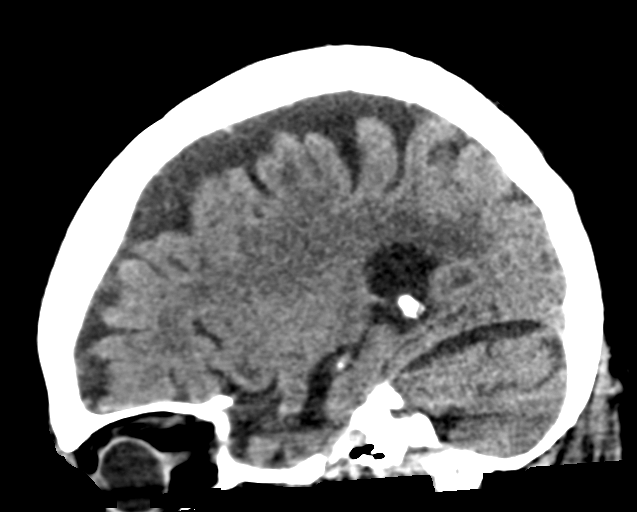

[15 of 46 positions shown; findings below may reference images not displayed]

FINDINGS: Brain: Moderate atrophy. Severe atrophy of the hippocampus
bilaterally consistent with Alzheimer's disease. Moderate chronic
microvascular ischemic change in the white matter.

Negative for acute infarct, hemorrhage, or mass lesion. No midline
shift.

Vascular: Atherosclerotic calcification. Negative for hyperdense
vessel

Skull: Negative

Sinuses/Orbits: Bilateral cataract surgery.  Paranasal sinuses clear

Other: None
IMPRESSION: No acute abnormality

Generalized atrophy most prominent in the hippocampus compatible
with Alzheimer's disease.

## 2018-11-14 ENCOUNTER — Other Ambulatory Visit: Payer: Self-pay

## 2018-11-14 ENCOUNTER — Emergency Department
Admission: EM | Admit: 2018-11-14 | Discharge: 2018-11-14 | Disposition: A | Discharge status: Skilled Nursing Facility | Payer: Medicare Other | Attending: Emergency Medicine | Admitting: Emergency Medicine

## 2018-11-14 ENCOUNTER — Emergency Department: Payer: Medicare Other

## 2018-11-14 DIAGNOSIS — J449 Chronic obstructive pulmonary disease, unspecified: Secondary | ICD-10-CM | POA: Diagnosis not present

## 2018-11-14 DIAGNOSIS — Y999 Unspecified external cause status: Secondary | ICD-10-CM | POA: Diagnosis not present

## 2018-11-14 DIAGNOSIS — W19XXXA Unspecified fall, initial encounter: Secondary | ICD-10-CM | POA: Diagnosis not present

## 2018-11-14 DIAGNOSIS — F0281 Dementia in other diseases classified elsewhere with behavioral disturbance: Secondary | ICD-10-CM | POA: Diagnosis not present

## 2018-11-14 DIAGNOSIS — Z79899 Other long term (current) drug therapy: Secondary | ICD-10-CM | POA: Diagnosis not present

## 2018-11-14 DIAGNOSIS — G309 Alzheimer's disease, unspecified: Secondary | ICD-10-CM | POA: Diagnosis not present

## 2018-11-14 DIAGNOSIS — S0990XA Unspecified injury of head, initial encounter: Secondary | ICD-10-CM | POA: Diagnosis present

## 2018-11-14 DIAGNOSIS — Y939 Activity, unspecified: Secondary | ICD-10-CM | POA: Diagnosis not present

## 2018-11-14 DIAGNOSIS — Y92129 Unspecified place in nursing home as the place of occurrence of the external cause: Secondary | ICD-10-CM | POA: Diagnosis not present

## 2018-11-14 DIAGNOSIS — I10 Essential (primary) hypertension: Secondary | ICD-10-CM | POA: Insufficient documentation

## 2018-11-14 NOTE — ED Notes (Signed)
TRIAGE NOTE:  Pt arrived via EMS from [facility] with reports of unwitnessed fall out of wheelchair, striking the back of his head and front of his head.  Pt has skin tear to back of the head and small hematoma to the front of his head.  Pt is not normally verbal per staff. pt is normally in wheelchair and per staff, they had been trying to get pt into bed due to frequently trying to get out of the wheelchair.  Staff reported to EMS that the patient has been leaning out of the wheelchair for several days. Pt has hx of dementia, copd and PTSD, pt can be combative at times per EMS.   Also per EMS pt was c/o back pain along with right arm and wrist pain.

## 2018-11-14 NOTE — ED Notes (Signed)
Pt moved from 19 to 19h for safety. Pt was found by nurse jennifer standing at the doorway urinating on the floor.  Pt was previously ambulated by nurse jennifer and dr. Burlene Arnt and ambulated without difficulty.

## 2018-11-14 NOTE — ED Notes (Signed)
Pt unable to sign for discharge. Paperwork given to ems

## 2018-11-14 NOTE — ED Provider Notes (Signed)
[facility] Emergency Department Provider Note  ____________________________________________   I have reviewed the triage vital signs and the nursing notes. Where available I have reviewed prior notes and, if possible and indicated, outside hospital notes.    HISTORY  Chief Complaint Fall    HPI Jahshua Bonito is a 84 y.o. male who suffers from severe dementia, his daughter, whom I spoke to, states he sometimes gets aggressive and sometimes is very polite and chatty, he also has sleep disturbances.  She feels he is at his baseline.  He had a non-witnessed fall today.  No syncope reported he was awake and alert at his baseline after he fell.  Patient history is mostly per EMS and his daughter and nursing home.  He is unable to give a good history.  Level 5 chart caveat; no further history available due to patient status.   Concern existed apparently by outside providers about his arm and the fact that he bumped his head.  Tetanus shot was reported to be up-to-date.  Patient is DNR has a most form and family wants him to be comfortable   Past Medical History:  Diagnosis Date  . Alzheimer's dementia   . Anemia   . Constipation   . COPD (chronic obstructive pulmonary disease) ([place])   . Coronary artery disease   . Diverticulosis   . Dysphagia, oral phase   . GERD (gastroesophageal reflux disease)   . Hyperlipemia   . Hypertension   . Hypokalemia   . Major depressive disorder   . 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 Pulmonary embolism ([place])   . Renal disorder   . Urinary incontinence   . Vitamin D deficiency     Patient Active Problem List   Diagnosis Date Noted  . Alzheimer's type dementia with late onset with behavioral disturbance ([place]) 07/07/2018    No past surgical history on file.  Prior to Admission medications   Medication Sig Start Date End Date Taking? Authorizing Provider  acetaminophen  (TYLENOL) 325 MG tablet Take 650 mg by mouth 3 (three) times daily as needed for mild pain.    Yes [provider]  aspirin EC 81 MG tablet Take 81 mg by mouth daily.   Yes [provider]  atorvastatin (LIPITOR) 20 MG tablet Take 20 mg by mouth at bedtime.    Yes [provider]  budesonide-formoterol (SYMBICORT) 160-4.5 MCG/ACT inhaler Inhale 2 puffs into the lungs 2 (two) times daily.   Yes [provider]  Cholecalciferol ([employer]S D3) 2000 units CAPS Take 2,000 Units by mouth daily.   Yes [provider]  docusate sodium (COLACE) 100 MG capsule Take 100 mg by mouth 2 (two) times daily.   Yes [provider]  fosinopril (MONOPRIL) 40 MG tablet Take 40 mg by mouth daily.    Yes [provider]  isosorbide mononitrate (IMDUR) 60 MG 24 hr tablet Take 60 mg by mouth daily.   Yes [provider]  Melatonin 3 MG TBDP Take 3 mg by mouth at bedtime.   Yes [provider]  memantine (NAMENDA) 10 MG tablet Take 10 mg by mouth 2 (two) times daily.   Yes [provider]  omeprazole (PRILOSEC) 10 MG capsule Take 10 mg by mouth daily.   Yes [provider]  tamsulosin (FLOMAX) 0.4 MG CAPS capsule Take 0.8 mg by mouth at bedtime.   Yes [provider]    Allergies Oxybutynin;  Rabeprazole; and Terazosin  No family history on file.  Social History Social History   Tobacco Use  . Smoking status: Unknown If Ever Smoked  . Smokeless tobacco: Never Used  Substance Use Topics  . Alcohol use: Not Currently  . Drug use: Not Currently    Review of Systems Level 5 chart caveat; no further history available due to patient status.  ____________________________________________   PHYSICAL EXAM:  VITAL SIGNS: ED Triage Vitals  Enc Vitals Group     BP 11/14/18 1702 135/65     Pulse Rate 11/14/18 1702 78     Resp 11/14/18 1702 18     Temp 11/14/18 1702 (!) 97.1 F (36.2 C)     Temp Source 11/14/18  1702 Axillary     SpO2 11/14/18 1702 97 %     Weight 11/14/18 1657 160 lb (72.6 kg)     Height 11/14/18 1657 5\' 4"  (1.626 m)     Head Circumference --      Peak Flow --      Pain Score --      Pain Loc --      Pain Edu? --      Excl. in [place]? --     Constitutional: Alert and pleasantly demented. Well appearing and in no acute distress. Eyes: Conjunctivae are normal Head: Abrasion to calvarium HEENT: No congestion/rhinnorhea. Mucous membranes are moist.  Oropharynx non-erythematous Neck:   Nontender with no meningismus, no masses, no stridor Cardiovascular: Normal rate, regular rhythm. Grossly normal heart sounds.  Good peripheral circulation. Respiratory: Normal respiratory effort.  No retractions. Lungs CTAB. Abdominal: Soft and nontender. No distention. No guarding no rebound Back:  There is no focal tenderness or step off.  there is no midline tenderness there are no lesions noted. there is no CVA tenderness Musculoskeletal: No lower extremity tenderness, no upper extremity tenderness. No joint effusions, no DVT signs strong distal pulses no edema, his arms are not tender there is no evidence of back or arm injury Neurologic: To ambulate with no difficulty, no cranial nerve deficits noted no obvious strength or sensation deficits to the extent that you can determine with the patient of this degree of dementia Skin:  Skin is warm, dry and intact. No rash noted. Psychiatric: Mood and affect are normal. Speech and behavior are normal.  ____________________________________________   LABS (all labs ordered are listed, but only abnormal results are displayed)  Labs Reviewed - No data to display  Pertinent labs  results that were available during my care of the patient were reviewed by me and considered in my medical decision making (see chart for details). ____________________________________________  EKG  I personally interpreted any EKGs ordered by me or  triage  ____________________________________________  RADIOLOGY  Pertinent labs & imaging results that were available during my care of the patient were reviewed by me and considered in my medical decision making (see chart for details). If possible, patient and/or family made aware of any abnormal findings.  No results found. ____________________________________________    PROCEDURES  Procedure(s) performed: None  Procedures  Critical Care performed: None  ____________________________________________   INITIAL IMPRESSION / ASSESSMENT AND PLAN / ED COURSE  Pertinent labs & imaging results that were available during my care of the patient were reviewed by me and considered in my medical decision making (see chart for details).  Patient here after a fall.  We were able to get a CT scan of his head and neck which I think will likely  be negative by my read.  I talked to his daughter, she does not wish blood work or anything invasive.  She wanted to make sure there was no broken bones.  Clinically I do not see any evidence of a fracture I can palpate his arm move his arm move his legs move his hips and is ambulatory there is no evidence of pain in any of these procedures.  If CT is negative is my hope we can get him home    ____________________________________________   FINAL CLINICAL IMPRESSION(S) / ED DIAGNOSES  Final diagnoses:  None      This chart was dictated using voice recognition software.  Despite best efforts to proofread,  errors can occur which can change meaning.      Schuyler Amor, MD 11/14/18 [phone]

## 2018-11-14 NOTE — ED Notes (Signed)
[facility] arrived to transport [facility] to [facility]

## 2018-11-14 NOTE — ED Notes (Signed)
Pt given food tray.

## 2018-11-14 NOTE — ED Notes (Signed)
Pt ate all of his sandwich, graham crackers, chips and 8 oz water

## 2019-04-26 DEATH — deceased
